# Patient Record
Sex: Male | Born: 1990 | Race: Asian | Hispanic: No | Marital: Single | Smoking: Never smoker
Health system: Southern US, Community
[De-identification: ages and names within clinical notes are randomized; demographics above are authoritative.]

---

## 2019-01-18 ENCOUNTER — Emergency Department (HOSPITAL_COMMUNITY): Payer: PRIVATE HEALTH INSURANCE

## 2019-01-18 ENCOUNTER — Encounter (HOSPITAL_COMMUNITY): Payer: Self-pay

## 2019-01-18 ENCOUNTER — Emergency Department (HOSPITAL_COMMUNITY)
Admission: EM | Admit: 2019-01-18 | Discharge: 2019-01-18 | Disposition: A | Discharge status: Home / Self Care | Payer: PRIVATE HEALTH INSURANCE | Attending: Emergency Medicine | Admitting: Emergency Medicine

## 2019-01-18 DIAGNOSIS — X19XXXA Contact with other heat and hot substances, initial encounter: Secondary | ICD-10-CM | POA: Insufficient documentation

## 2019-01-18 DIAGNOSIS — S6991XA Unspecified injury of right wrist, hand and finger(s), initial encounter: Secondary | ICD-10-CM | POA: Diagnosis present

## 2019-01-18 DIAGNOSIS — F17228 Nicotine dependence, chewing tobacco, with other nicotine-induced disorders: Secondary | ICD-10-CM | POA: Diagnosis not present

## 2019-01-18 DIAGNOSIS — Z23 Encounter for immunization: Secondary | ICD-10-CM | POA: Diagnosis not present

## 2019-01-18 DIAGNOSIS — Y9389 Activity, other specified: Secondary | ICD-10-CM | POA: Insufficient documentation

## 2019-01-18 DIAGNOSIS — Y92009 Unspecified place in unspecified non-institutional (private) residence as the place of occurrence of the external cause: Secondary | ICD-10-CM | POA: Diagnosis not present

## 2019-01-18 DIAGNOSIS — Y998 Other external cause status: Secondary | ICD-10-CM | POA: Insufficient documentation

## 2019-01-18 DIAGNOSIS — T23221A Burn of second degree of single right finger (nail) except thumb, initial encounter: Secondary | ICD-10-CM

## 2019-01-18 MED ORDER — TETANUS-DIPHTH-ACELL PERTUSSIS 5-2.5-18.5 LF-MCG/0.5 IM SUSP
0.5000 mL | Freq: Once | INTRAMUSCULAR | Status: AC
  Administered 2019-01-18: 0.5 mL via INTRAMUSCULAR
  Filled 2019-01-18: qty 0.5

## 2019-01-18 MED ORDER — SILVER SULFADIAZINE 1 % EX CREA
TOPICAL_CREAM | Freq: Once | CUTANEOUS | Status: AC
  Administered 2019-01-18: 19:00:00 via TOPICAL
  Filled 2019-01-18: qty 85

## 2019-01-18 MED ORDER — SILVER SULFADIAZINE 1 % EX CREA: 1.0000 "application " | TOPICAL_CREAM | Freq: Every day | CUTANEOUS | 0 refills | Status: AC

## 2019-01-18 MED ORDER — HYDROCODONE-ACETAMINOPHEN 5-325 MG PO TABS: 1.0000 | ORAL_TABLET | ORAL | 0 refills | Status: AC | PRN

## 2019-01-18 MED ORDER — KETOROLAC TROMETHAMINE 30 MG/ML IJ SOLN
30.0000 mg | Freq: Once | INTRAMUSCULAR | Status: AC
  Administered 2019-01-18: 30 mg via INTRAMUSCULAR
  Filled 2019-01-18: qty 1

## 2019-01-18 NOTE — ED Notes (Signed)
Portable xray at bedside.

## 2019-01-18 NOTE — ED Triage Notes (Signed)
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 Pt has cream on finger. Has full ROM and PMS intact. VSS

## 2019-01-18 NOTE — ED Provider Notes (Signed)
[facility] EMERGENCY [facility]PART[facility] Provider Note   CSN: [id number] Arrival date & time: 01/18/19  1717     History   Chief Complaint Chief Complaint  Patient presents with  . Burn    HPI Thomas Gould is a 28 y.o. male.  Pt presents to the ED today with a burn to his right index finger.  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  Pt put burn cream to the finger pta.  He did not feel like the electricity went through his body.  No exit wounds.       History reviewed. No pertinent past medical history.  There are no active problems to display for this patient.   History reviewed. No pertinent surgical history.      Home Medications    Prior to Admission medications   Medication Sig Start Date End Date Taking? Authorizing Provider  HYDROcodone-acetaminophen (NORCO/VICODIN) 5-325 MG tablet Take 1 tablet by mouth every 4 (four) hours as needed. 01/18/19   Jacalyn LefevreHaviland, Bev Drennen, MD  silver sulfADIAZINE (SILVADENE) 1 % cream Apply 1 application topically daily. 01/18/19   Jacalyn LefevreHaviland, Keily Lepp, MD    Family History History reviewed. No pertinent family history.  Social History Social History   Tobacco Use  . Smoking status: Never Smoker  . Smokeless tobacco: Current User    Types: Chew  Substance Use Topics  . Alcohol use: Yes    Frequency: Never    Comment: occ  . Drug use: Never     Allergies   Patient has no known allergies.   Review of Systems Review of Systems  Musculoskeletal:       Burn to right middle finger.  All other systems reviewed and are negative.    Physical Exam Updated Vital Signs BP (!) 161/100 (BP Location: Left Arm)   Pulse 91   Temp 98.2 F (36.8 C) (Oral)   Resp 20   SpO2 100%   Physical Exam Vitals signs and nursing note reviewed.  Constitutional:      Appearance: Normal appearance. He is normal weight.  HENT:     Head: Normocephalic and  atraumatic.     Right Ear: External ear normal.     Left Ear: External ear normal.     Nose: Nose normal.     Mouth/Throat:     Mouth: Mucous membranes are moist.     Pharynx: Oropharynx is clear.  Eyes:     Extraocular Movements: Extraocular movements intact.     Conjunctiva/sclera: Conjunctivae normal.     Pupils: Pupils are equal, round, and reactive to light.  Neck:     Musculoskeletal: Normal range of motion and neck supple.  Cardiovascular:     Rate and Rhythm: Normal rate and regular rhythm.     Pulses: Normal pulses.     Heart sounds: Normal heart sounds.  Pulmonary:     Effort: Pulmonary effort is normal.     Breath sounds: Normal breath sounds.  Abdominal:     General: Abdomen is flat.  Musculoskeletal:     Comments: Burn to right index finger  Skin:    General: Skin is warm.     Capillary Refill: Capillary refill takes less than 2 seconds.     Comments: See picture.    Noncircumferential.  He is able to feel me touch his finger.  Neurological:     Mental Status: He is alert and oriented to  person, place, and time.  Psychiatric:        Mood and Affect: Mood normal.          ED Treatments / Results  Labs (all labs ordered are listed, but only abnormal results are displayed) Labs Reviewed - No data to display  EKG None  Radiology Dg Finger Index Right  Result Date: 01/18/2019 CLINICAL DATA:  Electrical burn right index finger 1-2 hours ago. EXAM: RIGHT INDEX FINGER 2+V COMPARISON:  None. FINDINGS: There is no evidence of fracture or dislocation. There is no evidence of arthropathy or other focal bone abnormality. Soft tissues are unremarkable. IMPRESSION: Negative. Electronically Signed   By: Elberta Fortisaniel  Boyle M.D.   On: 01/18/2019 18:13    Procedures Procedures (including critical care time)  Medications Ordered in ED Medications  silver sulfADIAZINE (SILVADENE) 1 % cream (has no administration in time range)  Tdap (BOOSTRIX) injection 0.5 mL (0.5  mLs Intramuscular Given 01/18/19 1804)  ketorolac (TORADOL) 30 MG/ML injection 30 mg (30 mg Intramuscular Given 01/18/19 1803)     Initial Impression / Assessment and Plan / ED Course  I have reviewed the triage vital signs and the nursing notes.  Pertinent labs & imaging results that were available during my care of the patient were reviewed by me and considered in my medical decision making (see chart for details).    Wound was cleaned and dressed with silvadene.  Pt d/w Dr. Izora Ribasoley who will follow him in his office.    Final Clinical Impressions(s) / ED Diagnoses   Final diagnoses:  Partial thickness burn of right index finger    ED Discharge Orders         Ordered    HYDROcodone-acetaminophen (NORCO/VICODIN) 5-325 MG tablet  Every 4 hours PRN     01/18/19 1841    silver sulfADIAZINE (SILVADENE) 1 % cream  Daily     01/18/19 1842           Jacalyn LefevreHaviland, Liseth Wann, MD 01/18/19 1843

## 2019-09-18 IMAGING — DX DG FINGER INDEX 2+V*R*
1 series · 3 of 3 positions shown · non-contrast
Comparison: None.

CLINICAL DATA: Electrical burn right index finger 1-2 hours ago.

EXAM:
RIGHT INDEX FINGER 2+V

[Series 1: finger · 0.14mm/px · 3 of 3 slices shown]
[im 1/3]
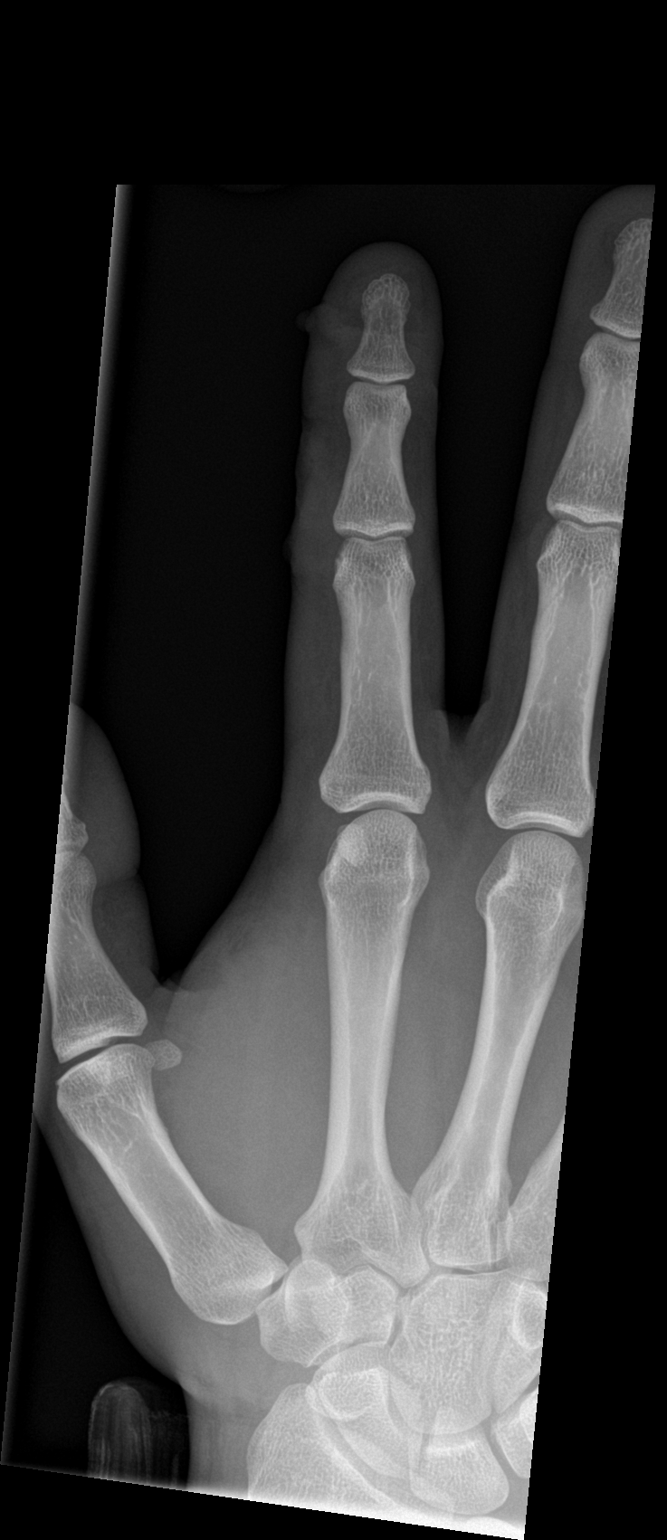
[im 2/3]
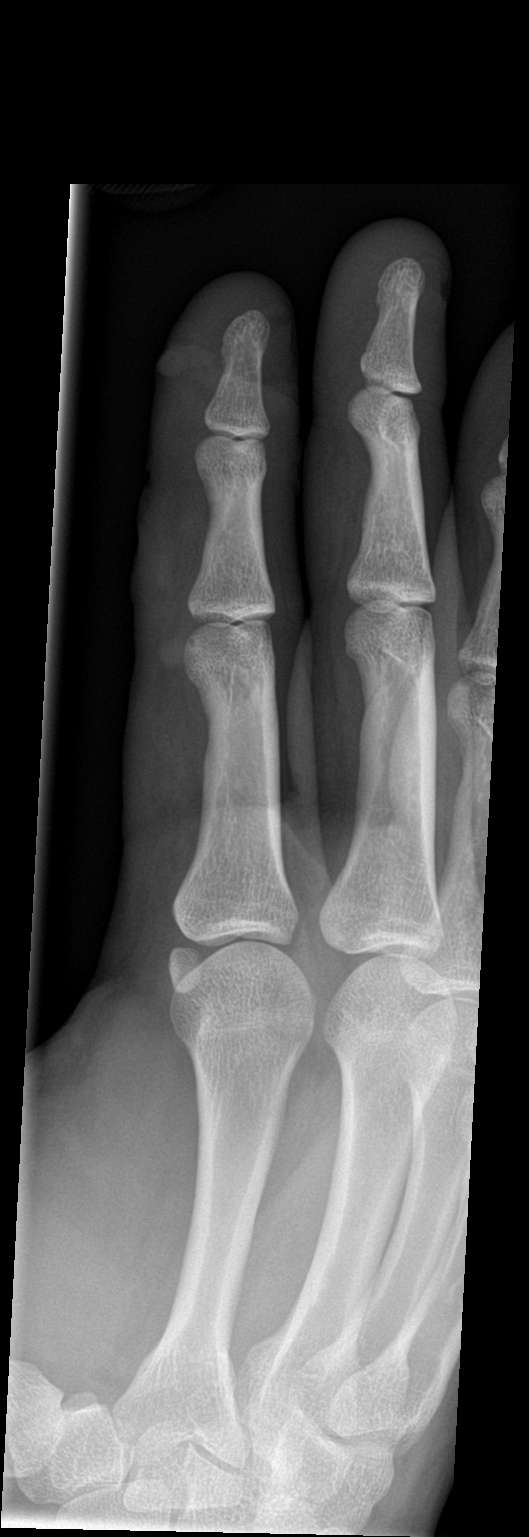
[im 3/3]
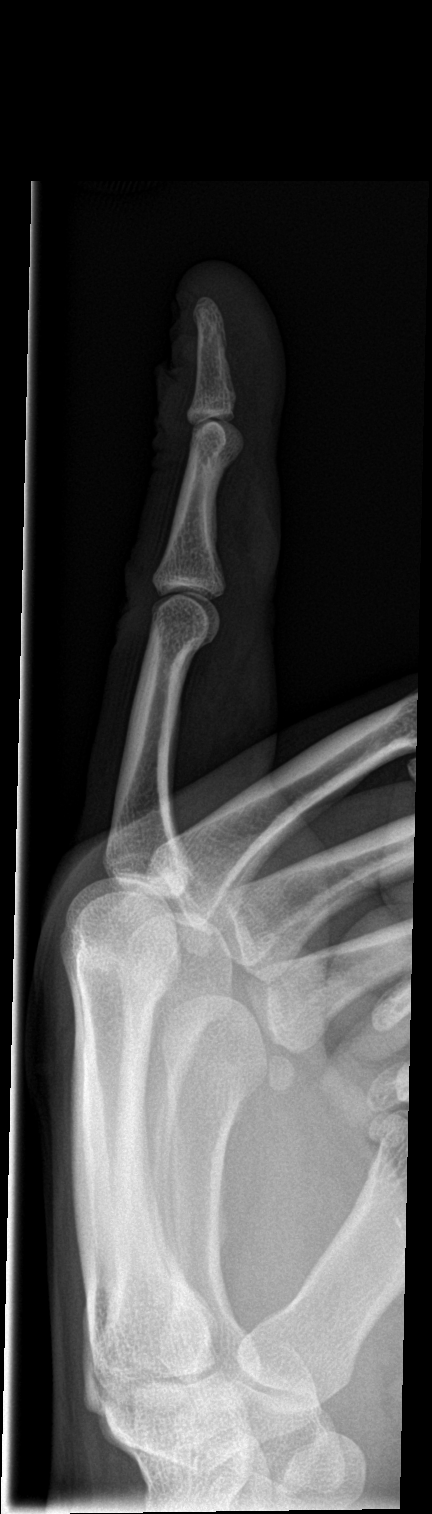

[3 of 3 positions shown; findings below may reference images not displayed]

FINDINGS: There is no evidence of fracture or dislocation. There is no
evidence of arthropathy or other focal bone abnormality. Soft
tissues are unremarkable.
IMPRESSION: Negative.
